# Patient Record
Sex: Female | Born: 1980 | Race: White | Hispanic: No | Marital: Single | Smoking: Former smoker
Health system: Southern US, Community
[De-identification: ages and names within clinical notes are randomized; demographics above are authoritative.]

## PROBLEM LIST (undated history)

## (undated) DIAGNOSIS — F4310 Post-traumatic stress disorder, unspecified: Secondary | ICD-10-CM

## (undated) DIAGNOSIS — F419 Anxiety disorder, unspecified: Secondary | ICD-10-CM

## (undated) HISTORY — DX: Post-traumatic stress disorder, unspecified: F43.10

## (undated) HISTORY — DX: Anxiety disorder, unspecified: F41.9

---

## 2020-01-19 ENCOUNTER — Telehealth: Payer: Self-pay | Admitting: Adult Health

## 2020-01-19 NOTE — Telephone Encounter (Signed)

## 2020-01-20 ENCOUNTER — Encounter: Payer: Self-pay | Admitting: Adult Health

## 2020-02-03 ENCOUNTER — Telehealth: Payer: Self-pay | Admitting: Adult Health

## 2020-02-03 NOTE — Telephone Encounter (Signed)
Tried to reach the patient to remind her of her appointment/restrictions, voicemail not set up. 

## 2020-02-04 ENCOUNTER — Ambulatory Visit (INDEPENDENT_AMBULATORY_CARE_PROVIDER_SITE_OTHER): Payer: Self-pay | Admitting: Adult Health

## 2020-02-04 ENCOUNTER — Encounter: Payer: Self-pay | Admitting: Adult Health

## 2020-02-04 ENCOUNTER — Other Ambulatory Visit: Payer: Self-pay

## 2020-02-04 VITALS — BP 128/91 | HR 99 | Ht 66.0 in | Wt 163.2 lb

## 2020-02-04 DIAGNOSIS — Z3202 Encounter for pregnancy test, result negative: Secondary | ICD-10-CM

## 2020-02-04 DIAGNOSIS — R102 Pelvic and perineal pain: Secondary | ICD-10-CM

## 2020-02-04 DIAGNOSIS — K5900 Constipation, unspecified: Secondary | ICD-10-CM

## 2020-02-04 DIAGNOSIS — N898 Other specified noninflammatory disorders of vagina: Secondary | ICD-10-CM

## 2020-02-04 DIAGNOSIS — R140 Abdominal distension (gaseous): Secondary | ICD-10-CM

## 2020-02-04 DIAGNOSIS — Z975 Presence of (intrauterine) contraceptive device: Secondary | ICD-10-CM

## 2020-02-04 DIAGNOSIS — R110 Nausea: Secondary | ICD-10-CM

## 2020-02-04 LAB — POCT URINE PREGNANCY: Preg Test, Ur: NEGATIVE

## 2020-02-04 NOTE — Progress Notes (Signed)
Patient ID: Arlyn Bumpus, female   DOB: Jun 10, 1981, 39 y.o.   MRN: [id number] History of Present Illness: Erasmo Downer is a 39 year old white female, single, G1P1 in complaining of vaginal discharge and pelvic pain for a month, it is intermittent she says.She has an IUD. PCP is [facility].    Current Medications, Allergies, Past Medical History, Past Surgical History, Family History and Social History were reviewed in [employer] record.     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 But feels hot at times      Physical Exam:BP (!) 128/91 (BP Location: Left Arm, Patient Position: Sitting, Cuff Size: Normal)   Pulse 99   Ht 5' 6" (1.676 m)   Wt 163 lb 3.2 oz (74 kg)   BMI 26.34 kg/m  UPT is negative. General:  Well developed, well nourished, no acute distress Skin:  Warm and dry Neck:  Midline trachea, normal thyroid, good ROM, no lymphadenopathy Lungs; Clear to auscultation bilaterally Cardiovascular: Regular rate and rhythm Abdomen:  Soft, no hepatosplenomegaly, mildly tender LUQ Pelvic:  External genitalia is normal in appearance, no lesions.  The vagina is normal in appearance. Urethra has no lesions or masses. The cervix is bulbous, +IUD strings at os, no CMT, Nuswab obtained.  Uterus is felt to be normal size, shape, and contour.  No adnexal masses, bilateral  tenderness noted, no rebound tenderness noted.Bladder is tender, no masses felt. Psych: Alert and cooperative.  Fall risk is low PHQ 9 score is 8 denies any SI. Examination chaperoned by Rolena Infante LPN  Impression and Plan: 1. Pelvic pain Will get  Pelvic US 2/26 at 3 pm at [place] with Diff, CMP, ESR and lipase, will talk tomorrow when labs back   2. Bloating Will get [place]   3. Vaginal discharge Nuswab sent  4. Nausea in adult She declines nausea medication  5. IUD (intrauterine device) in place  6. Constipation, unspecified constipation type  If pain increases or changes, go to ER Follow up in 5 days, she could not come Monday

## 2020-02-05 ENCOUNTER — Other Ambulatory Visit: Payer: Self-pay | Admitting: Adult Health

## 2020-02-05 ENCOUNTER — Ambulatory Visit (HOSPITAL_COMMUNITY)
Admission: RE | Admit: 2020-02-05 | Discharge: 2020-02-05 | Disposition: A | Discharge status: Home / Self Care | Payer: Self-pay | Source: Ambulatory Visit | Attending: Adult Health | Admitting: Adult Health

## 2020-02-05 DIAGNOSIS — R140 Abdominal distension (gaseous): Secondary | ICD-10-CM

## 2020-02-05 DIAGNOSIS — R102 Pelvic and perineal pain: Secondary | ICD-10-CM | POA: Insufficient documentation

## 2020-02-05 LAB — CBC WITH DIFFERENTIAL/PLATELET
Basophils Absolute: 0 10*3/uL (ref 0.0–0.2)
Basos: 0 %
EOS (ABSOLUTE): 0 10*3/uL (ref 0.0–0.4)
Eos: 1 %
Hematocrit: 42 % (ref 34.0–46.6)
Hemoglobin: 14.2 g/dL (ref 11.1–15.9)
Immature Grans (Abs): 0 10*3/uL (ref 0.0–0.1)
Immature Granulocytes: 0 %
Lymphocytes Absolute: 1.1 10*3/uL (ref 0.7–3.1)
Lymphs: 20 %
MCH: 34.4 pg — ABNORMAL HIGH (ref 26.6–33.0)
MCHC: 33.8 g/dL (ref 31.5–35.7)
MCV: 102 fL — ABNORMAL HIGH (ref 79–97)
Monocytes Absolute: 0.6 10*3/uL (ref 0.1–0.9)
Monocytes: 11 %
Neutrophils Absolute: 3.8 10*3/uL (ref 1.4–7.0)
Neutrophils: 68 %
Platelets: 202 10*3/uL (ref 150–450)
RBC: 4.13 x10E6/uL (ref 3.77–5.28)
RDW: 14.3 % (ref 11.7–15.4)
WBC: 5.6 10*3/uL (ref 3.4–10.8)

## 2020-02-05 LAB — COMPREHENSIVE METABOLIC PANEL
ALT: 207 IU/L — ABNORMAL HIGH (ref 0–32)
AST: 321 IU/L — ABNORMAL HIGH (ref 0–40)
Albumin/Globulin Ratio: 1.5 (ref 1.2–2.2)
Albumin: 4.4 g/dL (ref 3.8–4.8)
Alkaline Phosphatase: 124 IU/L — ABNORMAL HIGH (ref 39–117)
BUN/Creatinine Ratio: 11 (ref 9–23)
BUN: 9 mg/dL (ref 6–20)
Bilirubin Total: 1.7 mg/dL — ABNORMAL HIGH (ref 0.0–1.2)
CO2: 21 mmol/L (ref 20–29)
Calcium: 9.2 mg/dL (ref 8.7–10.2)
Chloride: 102 mmol/L (ref 96–106)
Creatinine, Ser: 0.85 mg/dL (ref 0.57–1.00)
GFR calc Af Amer: 101 mL/min/{1.73_m2} (ref >59)
GFR calc non Af Amer: 88 mL/min/{1.73_m2} (ref >59)
Globulin, Total: 2.9 g/dL (ref 1.5–4.5)
Glucose: 110 mg/dL — ABNORMAL HIGH (ref 65–99)
Potassium: 3.8 mmol/L (ref 3.5–5.2)
Sodium: 142 mmol/L (ref 134–144)
Total Protein: 7.3 g/dL (ref 6.0–8.5)

## 2020-02-05 LAB — SEDIMENTATION RATE: Sed Rate: 19 mm/hr (ref 0–32)

## 2020-02-07 LAB — NUSWAB VAGINITIS PLUS (VG+)
Candida albicans, NAA: NEGATIVE
Candida glabrata, NAA: NEGATIVE
Chlamydia trachomatis, NAA: NEGATIVE
Neisseria gonorrhoeae, NAA: NEGATIVE
Trich vag by NAA: NEGATIVE

## 2020-02-08 ENCOUNTER — Telehealth: Payer: Self-pay | Admitting: Adult Health

## 2020-02-08 ENCOUNTER — Encounter: Payer: Self-pay | Admitting: Gastroenterology

## 2020-02-08 NOTE — Telephone Encounter (Signed)
No VM  

## 2020-02-09 ENCOUNTER — Telehealth: Payer: Self-pay | Admitting: Adult Health

## 2020-02-09 ENCOUNTER — Encounter: Payer: Self-pay | Admitting: Adult Health

## 2020-02-09 ENCOUNTER — Telehealth: Payer: Self-pay | Admitting: *Deleted

## 2020-02-09 DIAGNOSIS — R748 Abnormal levels of other serum enzymes: Secondary | ICD-10-CM

## 2020-02-09 DIAGNOSIS — R140 Abdominal distension (gaseous): Secondary | ICD-10-CM

## 2020-02-09 HISTORY — DX: Abnormal levels of other serum enzymes: R74.8

## 2020-02-09 LAB — LIPASE: Lipase: 28 U/L (ref 14–72)

## 2020-02-09 LAB — SPECIMEN STATUS REPORT

## 2020-02-09 NOTE — Telephone Encounter (Signed)
Pt called requesting results. Please call. Thanks!!

## 2020-02-09 NOTE — Telephone Encounter (Signed)
Pt aware that pelvic US was normal, and that liver enzymes elevated and need to get abdomen [place], scheduled at [facility] for 02/12/20 at 8:30 am be there at 8:15 and NPO after MN, then see me at 10;10 am on Friday

## 2020-02-09 NOTE — Telephone Encounter (Signed)
[facility] called pt with results. Encounter closed. JSY

## 2020-02-10 ENCOUNTER — Ambulatory Visit: Payer: Medicaid Other | Admitting: Adult Health

## 2020-02-11 ENCOUNTER — Telehealth: Payer: Self-pay | Admitting: Adult Health

## 2020-02-11 NOTE — Telephone Encounter (Signed)

## 2020-02-12 ENCOUNTER — Other Ambulatory Visit: Payer: Self-pay

## 2020-02-12 ENCOUNTER — Encounter: Payer: Self-pay | Admitting: Adult Health

## 2020-02-12 ENCOUNTER — Ambulatory Visit (HOSPITAL_COMMUNITY)
Admission: RE | Admit: 2020-02-12 | Discharge: 2020-02-12 | Disposition: A | Discharge status: Home / Self Care | Payer: Self-pay | Source: Ambulatory Visit | Attending: Adult Health | Admitting: Adult Health

## 2020-02-12 ENCOUNTER — Ambulatory Visit (INDEPENDENT_AMBULATORY_CARE_PROVIDER_SITE_OTHER): Payer: Self-pay | Admitting: Adult Health

## 2020-02-12 VITALS — BP 107/76 | HR 108 | Ht 66.0 in | Wt 164.0 lb

## 2020-02-12 DIAGNOSIS — R110 Nausea: Secondary | ICD-10-CM

## 2020-02-12 DIAGNOSIS — R748 Abnormal levels of other serum enzymes: Secondary | ICD-10-CM | POA: Insufficient documentation

## 2020-02-12 DIAGNOSIS — R140 Abdominal distension (gaseous): Secondary | ICD-10-CM

## 2020-02-12 DIAGNOSIS — R1012 Left upper quadrant pain: Secondary | ICD-10-CM

## 2020-02-12 NOTE — Progress Notes (Signed)
  Subjective:     Patient ID: Rebecca Frederick, female   DOB: 23-Feb-1981, 39 y.o.   MRN: [id number]  HPI Rebecca Frederick is a 39 year old white female,single, G1P1, back in follow up, of pelvic pain and nausea and bloating. A little better with pelvic pain. Her labs from last visit showed AST 321, ALT 207, alk phos 124, total bilirubin 1.7,ESR 19,WBC 5.6.lipase 28,nuswab negative and had normal pelvic US 02/05/20. She had abdominal US at [facility] this morning. PCP is [facility].   Review of Systems Still has nausea LUQ pain, into left flank, still and bloating  She days pelvic pain is intermittent and not has bad Reviewed past medical,surgical, social and family history. Reviewed medications and allergies.     Objective:   Physical Exam BP 107/76 (BP Location: Left Arm, Patient Position: Sitting, Cuff Size: Normal)   Pulse (!) 108   Ht '5\' 6"'$  (1.676 m)   Wt 164 lb (74.4 kg)   BMI 26.47 kg/m     Assessment:     1. Elevated liver enzymes Had abdominal  US this am at hospital results not back yet  2. Bloating  3. LUQ pain  4. Nausea in adult She declines meds    Plan:     We will talk when results of [place] back

## 2020-02-15 ENCOUNTER — Telehealth: Payer: Self-pay | Admitting: Adult Health

## 2020-02-15 ENCOUNTER — Encounter: Payer: Self-pay | Admitting: Adult Health

## 2020-02-15 DIAGNOSIS — K760 Fatty (change of) liver, not elsewhere classified: Secondary | ICD-10-CM | POA: Insufficient documentation

## 2020-02-15 DIAGNOSIS — R748 Abnormal levels of other serum enzymes: Secondary | ICD-10-CM

## 2020-02-15 NOTE — Telephone Encounter (Signed)
Pt aware that US showed hepatic steatosis and 2 mm kidney stone, will recheck LFTs and hepatitis panel this week

## 2020-02-25 LAB — HEPATITIS PANEL, ACUTE
Hep A IgM: NEGATIVE
Hep B C IgM: NEGATIVE
Hep C Virus Ab: <0.1 s/co ratio (ref 0.0–0.9)
Hepatitis B Surface Ag: NEGATIVE

## 2020-02-25 LAB — COMPREHENSIVE METABOLIC PANEL
ALT: 42 IU/L — ABNORMAL HIGH (ref 0–32)
AST: 34 IU/L (ref 0–40)
Albumin/Globulin Ratio: 1.5 (ref 1.2–2.2)
Albumin: 3.9 g/dL (ref 3.8–4.8)
Alkaline Phosphatase: 79 IU/L (ref 39–117)
BUN/Creatinine Ratio: 5 — ABNORMAL LOW (ref 9–23)
BUN: 5 mg/dL — ABNORMAL LOW (ref 6–20)
Bilirubin Total: 0.2 mg/dL (ref 0.0–1.2)
CO2: 22 mmol/L (ref 20–29)
Calcium: 9.4 mg/dL (ref 8.7–10.2)
Chloride: 104 mmol/L (ref 96–106)
Creatinine, Ser: 0.93 mg/dL (ref 0.57–1.00)
GFR calc Af Amer: 91 mL/min/{1.73_m2} (ref >59)
GFR calc non Af Amer: 79 mL/min/{1.73_m2} (ref >59)
Globulin, Total: 2.6 g/dL (ref 1.5–4.5)
Glucose: 83 mg/dL (ref 65–99)
Potassium: 4.1 mmol/L (ref 3.5–5.2)
Sodium: 139 mmol/L (ref 134–144)
Total Protein: 6.5 g/dL (ref 6.0–8.5)

## 2020-02-26 ENCOUNTER — Telehealth: Payer: Self-pay | Admitting: Adult Health

## 2020-02-26 NOTE — Telephone Encounter (Signed)
No VM, if she calls liver enzymes are down and does not have hepatitis

## 2020-02-29 ENCOUNTER — Telehealth: Payer: Self-pay | Admitting: Adult Health

## 2020-02-29 NOTE — Telephone Encounter (Signed)
Pt aware of  No hepatitis and liver enzymes down to almost normal, she is not having pain now and stopped taking tylenol and goody's

## 2020-05-11 ENCOUNTER — Telehealth: Payer: Self-pay | Admitting: Adult Health

## 2020-05-11 NOTE — Telephone Encounter (Signed)
No VM  

## 2020-05-11 NOTE — Telephone Encounter (Signed)
Patient called stating that she would like a call back from [place]. Pt states it has to do with some blood work and her acute abdominal pain. Please contact pt

## 2021-09-20 IMAGING — US US ABDOMEN COMPLETE
1 series · 14 of 25 positions shown · non-contrast
Comparison: None.

CLINICAL DATA: Elevated liver enzymes.  Abdominal pain.

EXAM:
ABDOMEN ULTRASOUND COMPLETE

[Series 1: us abdomen complete · 0.17mm/px · 14 of 169 slices shown]
[im 1/169]
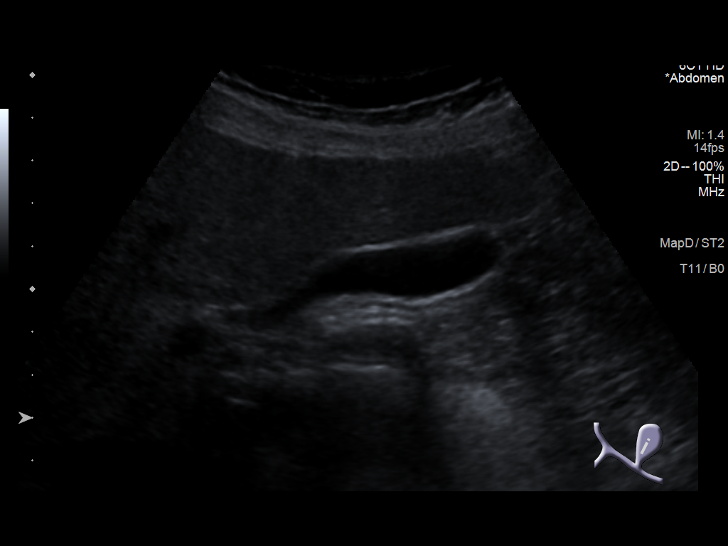
[im 15/169]
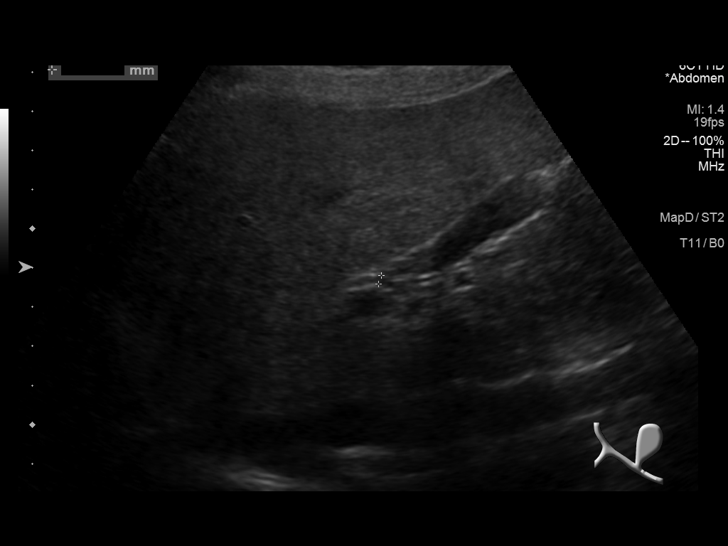
[im 29/169]
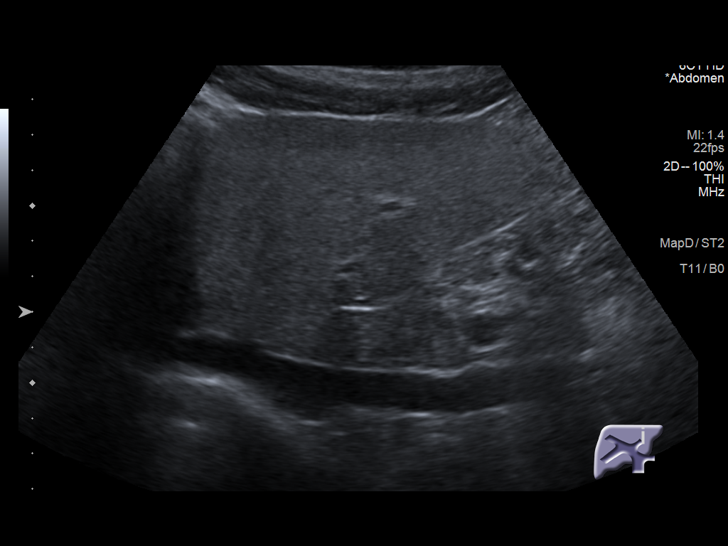
[im 43/169]
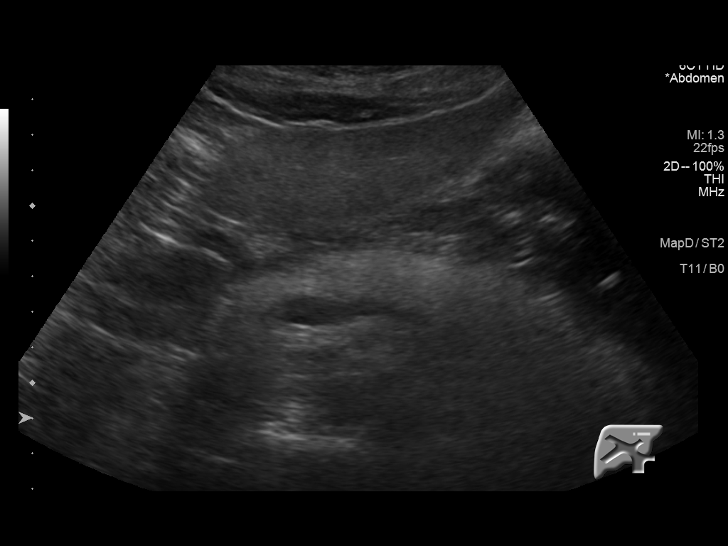
[im 57/169]
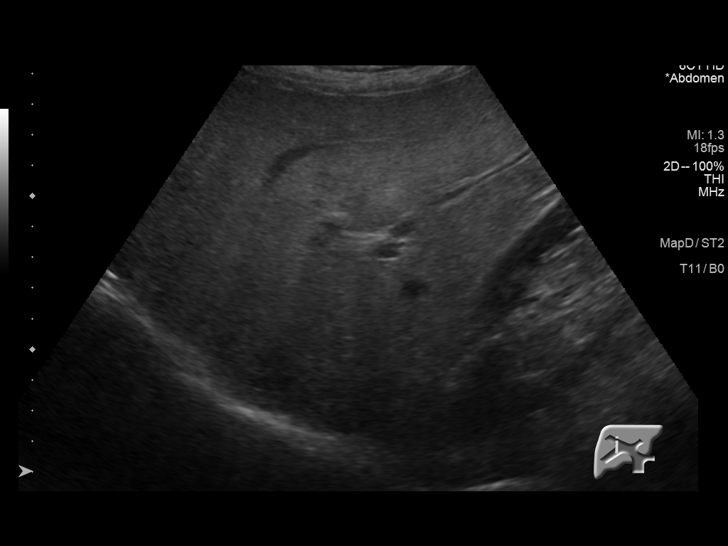
[im 64/169]
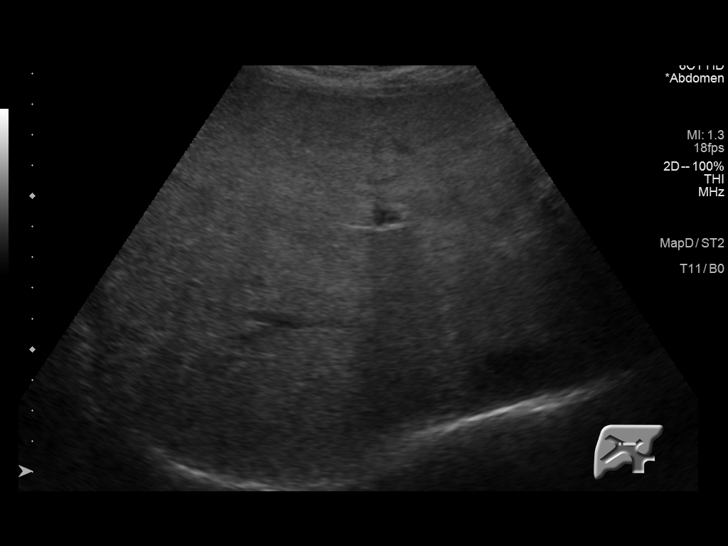
[im 78/169]
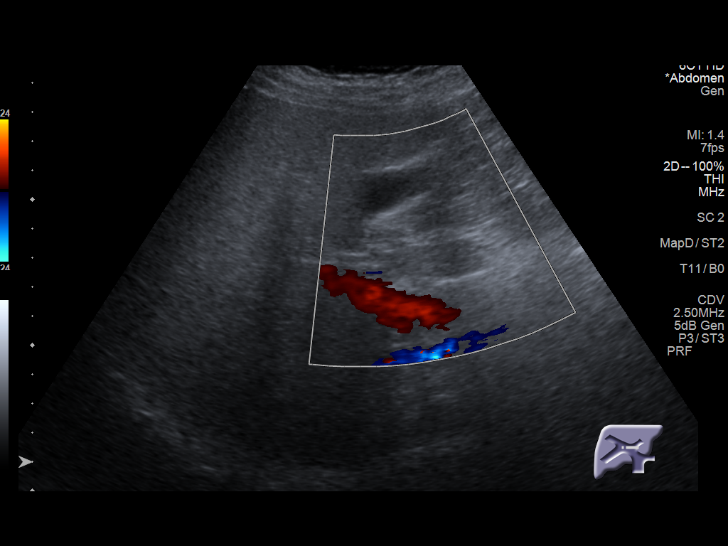
[im 92/169]
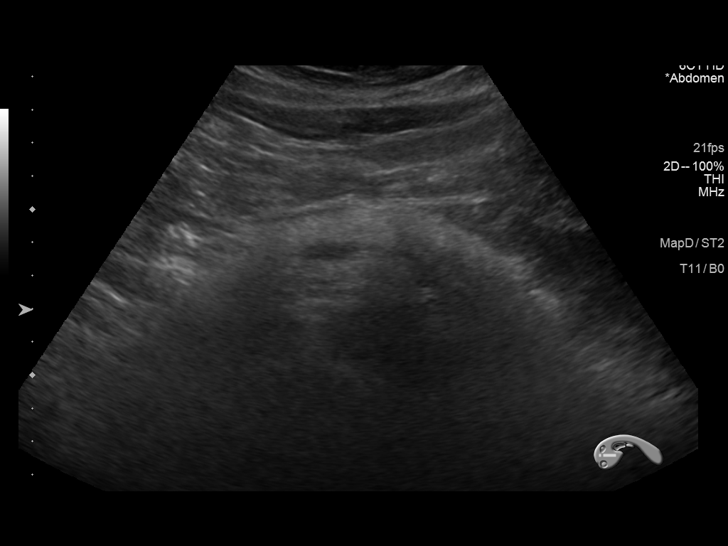
[im 106/169]
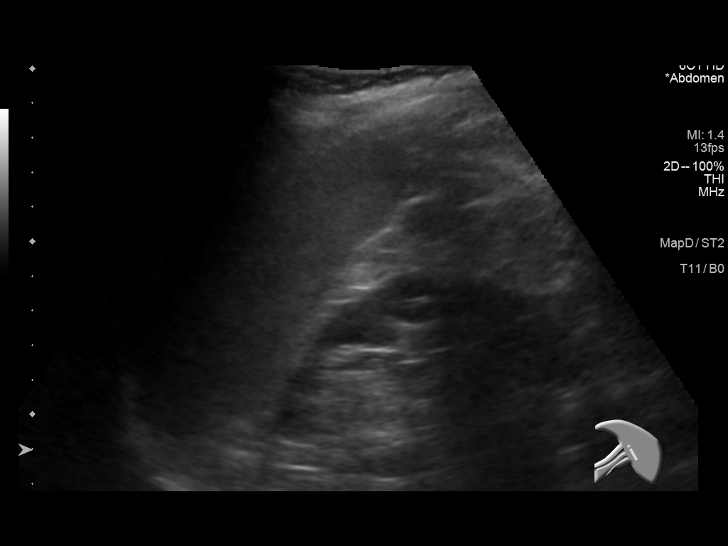
[im 113/169]
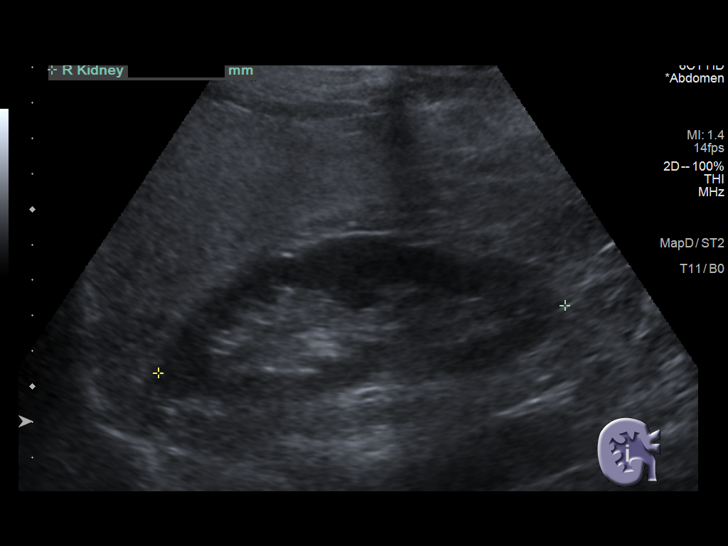
[im 127/169]
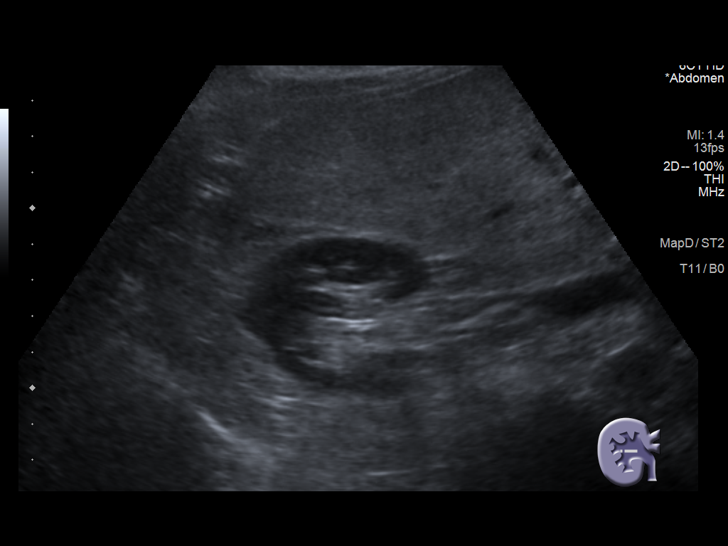
[im 141/169]
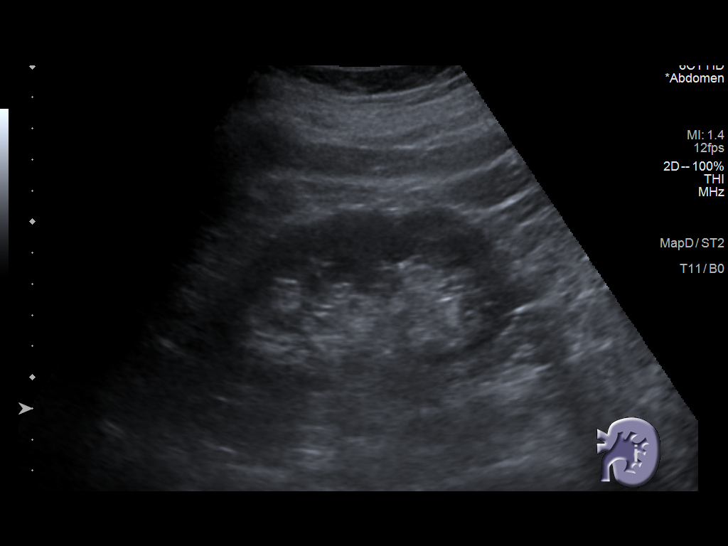
[im 155/169]
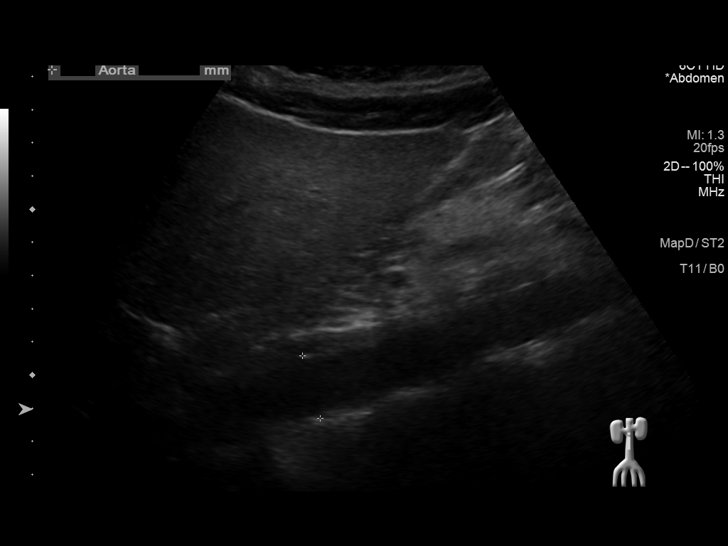
[im 169/169]
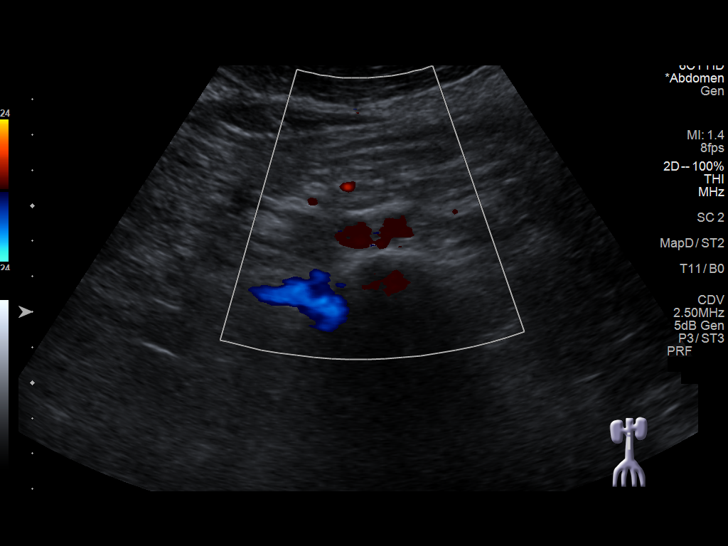

[14 of 25 positions shown; findings below may reference images not displayed]

FINDINGS: Gallbladder: No gallstones or wall thickening visualized. No
sonographic Murphy sign noted by sonographer.

Common bile duct: Diameter: 2.3 mm

Liver: No focal lesion identified. Diffuse increased echogenicity of
the liver parenchyma. Portal vein is patent on color Doppler imaging
with normal direction of blood flow towards the liver.

IVC: No abnormality visualized.

Pancreas: Visualized portion unremarkable.

Spleen: Size and appearance within normal limits.

Right Kidney: Length: 11.6 cm. Echogenicity within normal limits. No
mass or hydronephrosis visualized.

Left Kidney: Length: 10.9 cm. Tiny echogenic area in the upper pole
may represent a 2 mm stone. Echogenicity within normal limits. No
mass or hydronephrosis visualized.

Abdominal aorta: No aneurysm visualized.

Other findings: None.
IMPRESSION: 1. No acute abnormalities.
2. Hepatic steatosis.
3. Possible tiny stone in the upper pole of the left kidney.

## 2023-04-15 ENCOUNTER — Other Ambulatory Visit: Payer: Self-pay | Admitting: Nurse Practitioner

## 2023-04-15 DIAGNOSIS — Z1231 Encounter for screening mammogram for malignant neoplasm of breast: Secondary | ICD-10-CM

## 2023-04-22 ENCOUNTER — Ambulatory Visit (HOSPITAL_COMMUNITY)
Admission: RE | Admit: 2023-04-22 | Discharge: 2023-04-22 | Disposition: A | Discharge status: Home / Self Care | Payer: Medicaid Other | Source: Ambulatory Visit | Attending: Nurse Practitioner | Admitting: Nurse Practitioner

## 2023-04-22 DIAGNOSIS — Z1231 Encounter for screening mammogram for malignant neoplasm of breast: Secondary | ICD-10-CM | POA: Insufficient documentation
